# Patient Record
Sex: Female | Born: 1955 | Race: White | Hispanic: No
Health system: Southern US, Community
[De-identification: ages and names within clinical notes are randomized; demographics above are authoritative.]

---

## 2012-12-20 ENCOUNTER — Inpatient Hospital Stay: Payer: Self-pay | Admitting: Psychiatry

## 2012-12-20 LAB — URINALYSIS, COMPLETE
Bilirubin,UR: NEGATIVE
Glucose,UR: NEGATIVE mg/dL (ref 0–75)
RBC,UR: 5 /HPF (ref 0–5)
Specific Gravity: 1.015 (ref 1.003–1.030)

## 2012-12-21 LAB — BEHAVIORAL MEDICINE 1 PANEL
Albumin: 3.3 g/dL — ABNORMAL LOW (ref 3.4–5.0)
Anion Gap: 8 (ref 7–16)
BUN: 11 mg/dL (ref 7–18)
Basophil #: 0.1 10*3/uL (ref 0.0–0.1)
Basophil %: 1 %
Bilirubin,Total: 0.2 mg/dL (ref 0.2–1.0)
Creatinine: 0.52 mg/dL — ABNORMAL LOW (ref 0.60–1.30)
Glucose: 92 mg/dL (ref 65–99)
HCT: 46.2 % (ref 35.0–47.0)
HGB: 15.2 g/dL (ref 12.0–16.0)
Lymphocyte #: 2.7 10*3/uL (ref 1.0–3.6)
Lymphocyte %: 34.3 %
MCH: 31.5 pg (ref 26.0–34.0)
Neutrophil #: 4.1 10*3/uL (ref 1.4–6.5)
Neutrophil %: 51.9 %
Osmolality: 271 (ref 275–301)
Potassium: 4.2 mmol/L (ref 3.5–5.1)
RDW: 13.5 % (ref 11.5–14.5)
SGPT (ALT): 16 U/L (ref 12–78)
Sodium: 136 mmol/L (ref 136–145)
Thyroid Stimulating Horm: 2.01 u[IU]/mL
WBC: 7.9 10*3/uL (ref 3.6–11.0)

## 2012-12-23 LAB — HEPATIC FUNCTION PANEL A (ARMC)
Albumin: 3.4 g/dL (ref 3.4–5.0)
Alkaline Phosphatase: 70 U/L (ref 50–136)
Bilirubin, Direct: <0.1 mg/dL (ref 0.00–0.20)
Total Protein: 6.7 g/dL (ref 6.4–8.2)

## 2012-12-23 LAB — AMMONIA: Ammonia, Plasma: 54 mcmol/L — ABNORMAL HIGH (ref 11–32)

## 2012-12-24 LAB — HEPATIC FUNCTION PANEL A (ARMC)
Alkaline Phosphatase: 74 U/L (ref 50–136)
Bilirubin, Direct: <0.1 mg/dL (ref 0.00–0.20)
Bilirubin,Total: 0.3 mg/dL (ref 0.2–1.0)
SGOT(AST): 14 U/L — ABNORMAL LOW (ref 15–37)
Total Protein: 6.2 g/dL — ABNORMAL LOW (ref 6.4–8.2)

## 2012-12-24 LAB — VALPROIC ACID LEVEL: Valproic Acid: 38 ug/mL — ABNORMAL LOW

## 2012-12-24 LAB — AMMONIA: Ammonia, Plasma: 45 mcmol/L — ABNORMAL HIGH (ref 11–32)

## 2013-08-28 IMAGING — CR DG CHEST 2V
1 series · 2 of 2 positions shown · non-contrast
Comparison: none

REASON FOR EXAM: COPD flair with hx of pneumonia
COMMENTS:

[Series 1: w chest pa · 0.14mm/px · 2 of 2 slices shown]
[im 1/2]
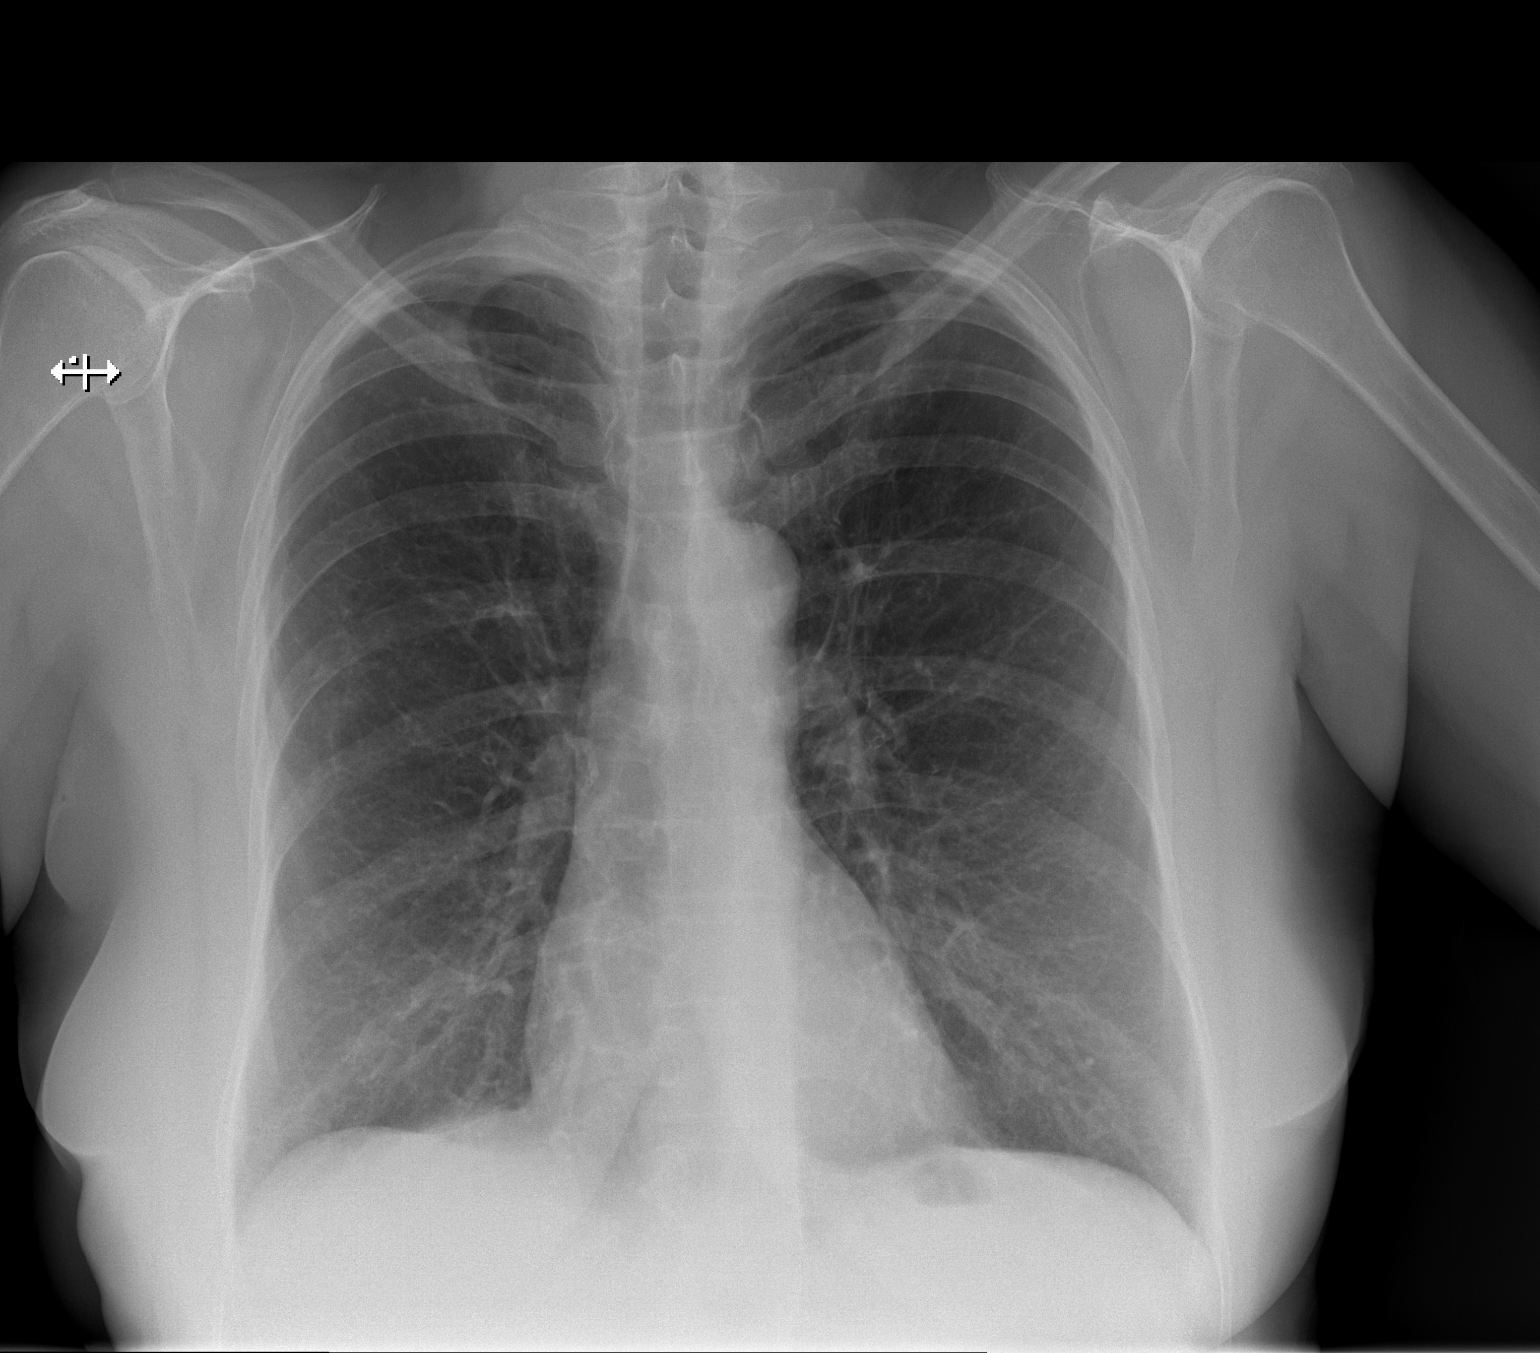
[im 2/2]
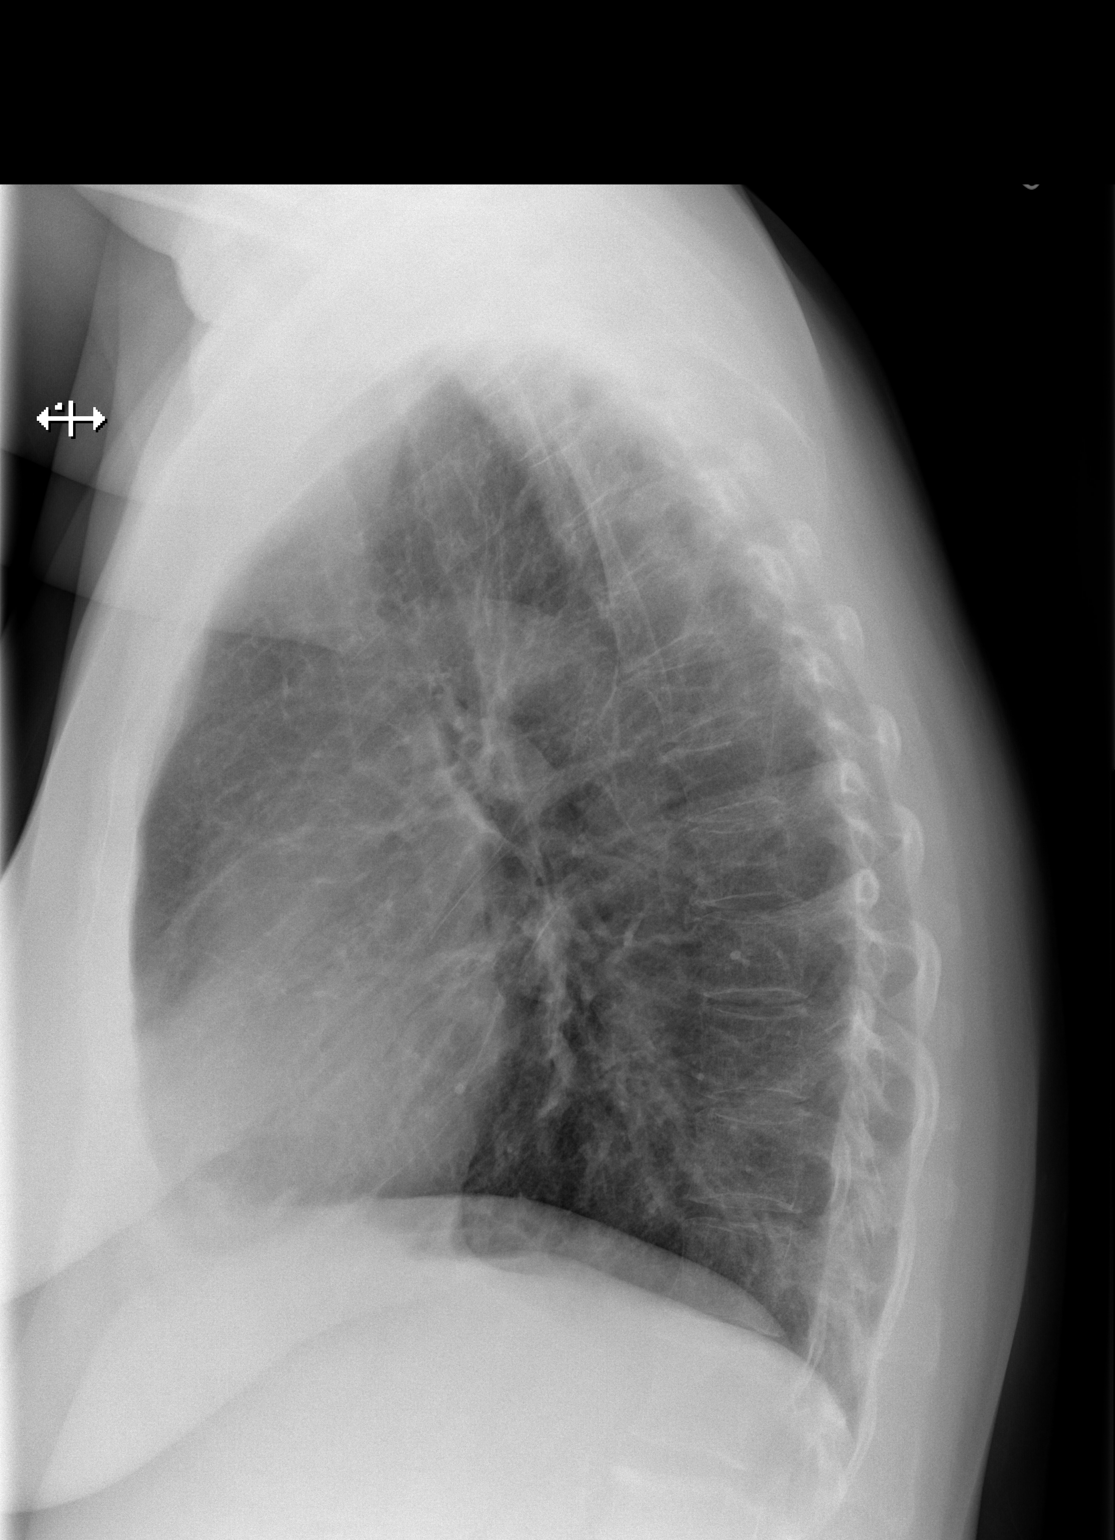

[2 of 2 positions shown; findings below may reference images not displayed]

PROCEDURE:     DXR - DXR CHEST PA (OR AP) AND LATERAL  - December 21, 2012 [DATE]

RESULT:     The lungs are hyperinflated. The lungs are clear. The heart and
pulmonary vessels are normal. The bony and mediastinal structures are
unremarkable. There is no effusion. There is no pneumothorax or evidence of
congestive failure.
IMPRESSION: No acute cardiopulmonary disease.

[REDACTED]

## 2015-02-07 NOTE — H&P (Signed)
PATIENT NAME:  Betty Garcia, Betty Garcia MR#:  [id number] DATE OF BIRTH:  01-11-1956  DATE OF ADMISSION:  12/20/2012  REFERRING PHYSICIAN: [facility] emergency physician.  IDENTIFYING DATA: The patient is a 59 year old female with history of bipolar.   CHIEF COMPLAINT: "I'm depressed."   HISTORY OF PRESENT ILLNESS: The patient has a long history of bipolar disorder. She does well when on medications. Three weeks ago, she was hospitalized at [facility] for a manic episode. She then crashed into depression and yesterday, she overdosed on 30 pills of clonazepam. She says that it was precipitated by financial difficulties, as she has a gambling problem. She reports poor sleep, decreased appetite, anhedonia, feeling of guilt, hopelessness, worthlessness, crying spells, social isolation and suicidal ideation ending up in serious suicide attempt. She denies psychotic symptoms. She feels more anxious, but denies having panic attacks. She denies alcohol, illicit drugs or prescription pill misuse.   PAST PSYCHIATRIC HISTORY: There are 2 prior hospitalizations for manic episodes. She has 1 prior suicide attempt by medication overdose. She does not remember any of the medications that were tried in the past.   FAMILY PSYCHIATRIC HISTORY: Her son has bipolar illness.   PAST MEDICAL HISTORY: COPD, psoriasis, kidney stone.   ALLERGIES: PENICILLIN.   MEDICATIONS ON ADMISSION: Abilify 20 mg daily, atorvastatin 20 mg daily, Risperdal 1 mg twice daily, VESIcare 5 mg daily, trazodone 100 mg at bedtime, Aristocort 0.1% cream to affected area.   SOCIAL HISTORY: She is disabled. She lives with her daughter. This is not a good living arrangement for her and following discharge she wants to move in with her son, who lives in a small town, and she feels that she will be less exposed to temptations of gambling.   REVIEW OF SYSTEMS:  CONSTITUTIONAL: No fevers or chills. No weight changes.  EYES: No double or blurred vision.   ENT: No hearing loss.  RESPIRATORY: No shortness of breath or cough.  CARDIOVASCULAR: No chest pain or orthopnea.  GASTROINTESTINAL: No abdominal pain, nausea, vomiting or diarrhea.  GENITOURINARY: No incontinence or frequency.  ENDOCRINE: No heat or cold intolerance.  LYMPHATIC: No anemia or easy bruising.  INTEGUMENTARY: Positive for psoriatic rash.  MUSCULOSKELETAL: No muscle or joint pain.  NEUROLOGIC: No tingling or weakness.  PSYCHIATRIC: See history of present illness for details.   PHYSICAL EXAMINATION:  VITAL SIGNS: Blood pressure 150/86, pulse 86, respirations 18, temperature 97.8.  GENERAL: This is a well-developed female in no acute distress.  HEENT: The pupils are equal, round and reactive to light. Sclerae anicteric.  NECK: Supple. No thyromegaly.  LUNGS: Clear to auscultation. No dullness to percussion.  HEART: Regular rhythm and rate. No murmurs, rubs or gallops.  ABDOMEN: Soft, nontender, nondistended. Positive bowel sounds.  MUSCULOSKELETAL: Normal muscle strength in all extremities.  SKIN: No rashes or bruises.  LYMPHATIC: No cervical adenopathy.  NEUROLOGIC: Cranial nerves II through XII are intact.   LABORATORY DATA: Performed at [facility]. Chemistries are within normal limits except for sodium of 135. CBC within normal limits. Alcohol level zero. Urine tox screen negative for substances. Urinalysis is not suggestive of urinary tract infection. Valproic acid level 15.   MENTAL STATUS EXAMINATION ON ADMISSION: The patient is asleep and hard to arouse. She is oriented to person and place. She thinks that it is Wednesday morning and argues about it. She is somewhat oriented to her situation. She is slightly irritable. She maintains limited eye contact. Her grooming is very poor. Speech is of normal  rhythm, rate and volume. Mood is depressed, affect labile. Thought processing is logical and goal oriented but slow. Thought content: She denies suicidal or homicidal  ideations but was admitted after a suicide attempt by Klonopin overdose. There are no delusions or paranoia. There are no auditory or visual hallucinations. Her cognition is impaired. Her insight and judgment are poor.   SUICIDE RISK ASSESSMENT ON ADMISSION: This is a patient with history of bipolar, recently hospitalized for a manic episode, who returns to the hospital after a suicide attempt in the context of medication noncompliance as indicated by low Depakote level.   DIAGNOSES:  AXIS I: Bipolar affective disorder, depressed.  AXIS II: Deferred.  AXIS III: Chronic obstructive pulmonary disease, hypertension.  AXIS IV: Mental illness, physical illness, primary support, financial, family conflict.  AXIS V: Global Assessment of Functioning on admission: 25.   PLAN: The patient was admitted to [facility] for safety, stabilization and medication management. She was initially placed on suicide precautions and was closely monitored for any unsafe behaviors. She underwent full psychiatric and risk assessment. She received pharmacotherapy, individual and group psychotherapy, substance abuse counseling and support from therapeutic milieu.  1.  Suicidal ideation: The patient denies. 2.  Mood: We restarted her on Abilify, Depakote and Risperdal, but it is puzzling if the patient can afford any of the medications. She has no health insurance as far as we know.  3.  Disposition: She will be discharged to home with her son.   ____________________________ Ellin GoodieJolanta B. Jennet MaduroPucilowska, MD [id number] D: 12/20/2012 17:46:28 ET T: 12/20/2012 18:34:58 ET JOB#: [id number]  cc: Jolanta B. Jennet MaduroPucilowska, MD, <Dictator> Shari ProwsJOLANTA B PUCILOWSKA MD ELECTRONICALLY SIGNED 01/14/2013 10:07
